# Patient Record
Sex: Male | Born: 2000 | Race: Black or African American | Hispanic: No | Marital: Single
Health system: Southern US, Community
[De-identification: ages and names within clinical notes are randomized; demographics above are authoritative.]

---
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
## 2022-07-30 ENCOUNTER — Ambulatory Visit (HOSPITAL_COMMUNITY)
Admission: EM | Admit: 2022-07-30 | Discharge: 2022-07-30 | Disposition: A | Discharge status: Home / Self Care | Payer: Medicaid Other | Attending: Psychiatry | Admitting: Psychiatry

## 2022-07-30 DIAGNOSIS — R454 Irritability and anger: Secondary | ICD-10-CM

## 2022-07-30 DIAGNOSIS — F1290 Cannabis use, unspecified, uncomplicated: Secondary | ICD-10-CM | POA: Insufficient documentation

## 2022-07-30 DIAGNOSIS — F909 Attention-deficit hyperactivity disorder, unspecified type: Secondary | ICD-10-CM | POA: Insufficient documentation

## 2022-07-30 DIAGNOSIS — F419 Anxiety disorder, unspecified: Secondary | ICD-10-CM | POA: Insufficient documentation

## 2022-07-30 DIAGNOSIS — Z9151 Personal history of suicidal behavior: Secondary | ICD-10-CM | POA: Insufficient documentation

## 2022-07-30 DIAGNOSIS — R45851 Suicidal ideations: Secondary | ICD-10-CM | POA: Insufficient documentation

## 2022-07-30 NOTE — Progress Notes (Signed)
   07/30/22 1411  [place] (Walk-ins at [facility] only)  How Did You Hear About [place]? Self  What Is the Reason for Your Visit/Call Today? Patient is a 22 year old male that presents voluntary to [facility] as a walk in. Patient denies any S/I, H/I or AVH although reports having passive S/I earlier this date but denies any plan or intent.  Patient reports ongoing depression and anxiety for the last several months with symptoms increasing over the last two weeks associated with anger management. Patient reports a prior treatment history "years ago" although is vague in reference to time line. Patent currently resides with his grandmother who brought him in today. Patient is trying to get shared custody of his one year old child who is currently residing with the mother. Patient reports ongoing Cannabis use although is vague in reference to amounts used and time frame. Patient is requesting assistance with counseling to address ongoing concerns.  How Long Has This Been Causing You Problems? 1 wk - 1 month  Have You Recently Had Any Thoughts About Hurting Yourself? No  Are You Planning to Commit Suicide/Harm Yourself At This time? No  Have you Recently Had Thoughts About [place]? No  Are You Planning To Harm Someone At This Time? No  Are you currently experiencing any auditory, visual or other hallucinations? No  Have You Used Any Alcohol or Drugs in the Past 24 Hours? No  Do you have any current medical co-morbidities that require immediate attention? No  Clinician description of patient physical appearance/behavior: Pt presents cooperative and is open to treatment interventions  What Do You Feel Would Help You the Most Today? Treatment for Depression or other mood problem  If access to [facility] Urgent Care was not available, would you have sought care in the Emergency Department? No  Determination of Need Routine (7 days)  Options For Referral Outpatient Therapy
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
## 2022-07-30 NOTE — ED Notes (Signed)
Discharge instructions provided and Pt stated understanding. Pt alert, orient and ambulatory prior to d/c from facility. Personal belongings returned. Safety maintained.

## 2022-07-30 NOTE — Discharge Instructions (Addendum)
Please come to [facility] (this facility, SECOND FLOOR) during walk in hours for appointment with psychiatrist/provider for further medication management and for therapists for therapy.   Walk in hours for therapy/counseling: Monday through Thursday 7:30AM until slots are full. Every Friday 12PM until slots are full.  Walk in hours for psychiatry/medication management: Monday through Friday 7:30AM until slots are full.   When you arrive please take the elevators to the SECOND floor. If you are unsure of where to go, inform the front desk that you are here for open access hours for the outpatient clinic and they will assist you with directions upstairs.   Walk ins are seen first come, first served. You may not be seen the same day you arrive. Please arrive early, such as by 7AM, to increase the likelihood of being seen the same day.  Address:  [address], in [place], [place] Ph: [phone]   [facility] Address: [place], [place], [place] [address] Phone: [phone]  Supported Employment The supported employment program is a person-centered, individualized, evidence-based support service that helps members choose, acquire, and maintain competitive employment in our community. This service supports the varying needs of individuals and promotes community inclusion and employment success. Members enrolled in the supported employment program can expect the following:  Development of an individual career plan Community based job placement Job shadowing Job development On-site job Teacher, early years/pre and support  Supported Education Supported education helps our members receive the education and training they need to achieve their learning and recovery goals. This will assist members with becoming gainfully employed in the job or career of their choice. The program includes assistance with: Registering for disability  accommodations Enrolling in school and registering for classes Learning communication skills Scheduling tutoring sessions within your school [facility] partners with [employer] to help increase the success of clients seeking employment and educational goals.  Want to learn more about our programs?   Please contact our intake department INTAKE: [phone] Ext [phone]  Mailing: [place]   [place], [place] [address]   [email or web address]

## 2022-08-11 ENCOUNTER — Ambulatory Visit (HOSPITAL_COMMUNITY): Payer: Medicaid Other | Admitting: Physician Assistant

## 2022-08-21 ENCOUNTER — Emergency Department (HOSPITAL_COMMUNITY): Payer: Medicaid Other

## 2022-08-21 ENCOUNTER — Inpatient Hospital Stay (HOSPITAL_COMMUNITY): Payer: Medicaid Other

## 2022-08-21 ENCOUNTER — Inpatient Hospital Stay (HOSPITAL_COMMUNITY)
Admission: EM | Admit: 2022-08-21 | Discharge: 2022-08-28 | DRG: 957 | Disposition: A | Discharge status: Home Health | Payer: Medicaid Other | Attending: General Surgery | Admitting: General Surgery

## 2022-08-21 ENCOUNTER — Encounter (HOSPITAL_COMMUNITY): Payer: Self-pay

## 2022-08-21 DIAGNOSIS — S27321A Contusion of lung, unilateral, initial encounter: Secondary | ICD-10-CM

## 2022-08-21 DIAGNOSIS — I7102 Dissection of abdominal aorta: Secondary | ICD-10-CM

## 2022-08-21 DIAGNOSIS — R451 Restlessness and agitation: Secondary | ICD-10-CM | POA: Diagnosis present

## 2022-08-21 DIAGNOSIS — S3600XA Unspecified injury of spleen, initial encounter: Secondary | ICD-10-CM

## 2022-08-21 DIAGNOSIS — F4325 Adjustment disorder with mixed disturbance of emotions and conduct: Secondary | ICD-10-CM | POA: Diagnosis not present

## 2022-08-21 DIAGNOSIS — S060XAA Concussion with loss of consciousness status unknown, initial encounter: Secondary | ICD-10-CM | POA: Diagnosis present

## 2022-08-21 DIAGNOSIS — R7401 Elevation of levels of liver transaminase levels: Secondary | ICD-10-CM | POA: Diagnosis present

## 2022-08-21 DIAGNOSIS — R402112 Coma scale, eyes open, never, at arrival to emergency department: Secondary | ICD-10-CM | POA: Diagnosis present

## 2022-08-21 DIAGNOSIS — I7103 Dissection of thoracoabdominal aorta: Secondary | ICD-10-CM | POA: Diagnosis present

## 2022-08-21 DIAGNOSIS — S36031A Moderate laceration of spleen, initial encounter: Secondary | ICD-10-CM | POA: Diagnosis present

## 2022-08-21 DIAGNOSIS — Y92410 Unspecified street and highway as the place of occurrence of the external cause: Secondary | ICD-10-CM | POA: Diagnosis not present

## 2022-08-21 DIAGNOSIS — N179 Acute kidney failure, unspecified: Secondary | ICD-10-CM | POA: Diagnosis present

## 2022-08-21 DIAGNOSIS — R402212 Coma scale, best verbal response, none, at arrival to emergency department: Secondary | ICD-10-CM | POA: Diagnosis present

## 2022-08-21 DIAGNOSIS — D62 Acute posthemorrhagic anemia: Secondary | ICD-10-CM | POA: Diagnosis not present

## 2022-08-21 DIAGNOSIS — R402342 Coma scale, best motor response, flexion withdrawal, at arrival to emergency department: Secondary | ICD-10-CM | POA: Diagnosis present

## 2022-08-21 DIAGNOSIS — S27322A Contusion of lung, bilateral, initial encounter: Secondary | ICD-10-CM | POA: Diagnosis present

## 2022-08-21 DIAGNOSIS — D649 Anemia, unspecified: Secondary | ICD-10-CM | POA: Diagnosis not present

## 2022-08-21 DIAGNOSIS — E876 Hypokalemia: Secondary | ICD-10-CM | POA: Diagnosis present

## 2022-08-21 DIAGNOSIS — R569 Unspecified convulsions: Secondary | ICD-10-CM | POA: Diagnosis present

## 2022-08-21 DIAGNOSIS — G4700 Insomnia, unspecified: Secondary | ICD-10-CM | POA: Diagnosis not present

## 2022-08-21 DIAGNOSIS — S52021A Displaced fracture of olecranon process without intraarticular extension of right ulna, initial encounter for closed fracture: Secondary | ICD-10-CM | POA: Diagnosis present

## 2022-08-21 DIAGNOSIS — S59901A Unspecified injury of right elbow, initial encounter: Secondary | ICD-10-CM | POA: Diagnosis present

## 2022-08-21 DIAGNOSIS — R739 Hyperglycemia, unspecified: Secondary | ICD-10-CM | POA: Diagnosis present

## 2022-08-21 DIAGNOSIS — I71019 Dissection of thoracic aorta, unspecified: Secondary | ICD-10-CM | POA: Diagnosis not present

## 2022-08-21 LAB — URINALYSIS, ROUTINE W REFLEX MICROSCOPIC
Bacteria, UA: NONE SEEN
Bilirubin Urine: NEGATIVE
Glucose, UA: 50 mg/dL — AB
Ketones, ur: NEGATIVE mg/dL
Leukocytes,Ua: NEGATIVE
Nitrite: NEGATIVE
Protein, ur: 100 mg/dL — AB
RBC / HPF: >50 RBC/hpf (ref 0–5)
Specific Gravity, Urine: 1.015 (ref 1.005–1.030)
pH: 5 (ref 5.0–8.0)

## 2022-08-21 LAB — CBC
HCT: 44 % (ref 39.0–52.0)
Hemoglobin: 15.1 g/dL (ref 13.0–17.0)
MCH: 29.9 pg (ref 26.0–34.0)
MCHC: 34.3 g/dL (ref 30.0–36.0)
MCV: 87.1 fL (ref 80.0–100.0)
Platelets: 210 10*3/uL (ref 150–400)
RBC: 5.05 MIL/uL (ref 4.22–5.81)
RDW: 12.1 % (ref 11.5–15.5)
WBC: 14.7 10*3/uL — ABNORMAL HIGH (ref 4.0–10.5)
nRBC: 0 % (ref 0.0–0.2)

## 2022-08-21 LAB — PROTIME-INR
INR: 1.1 (ref 0.8–1.2)
Prothrombin Time: 13.9 seconds (ref 11.4–15.2)

## 2022-08-21 LAB — SAMPLE TO BLOOD BANK

## 2022-08-21 LAB — COMPREHENSIVE METABOLIC PANEL
ALT: 182 U/L — ABNORMAL HIGH (ref 0–44)
AST: 258 U/L — ABNORMAL HIGH (ref 15–41)
Albumin: 4.2 g/dL (ref 3.5–5.0)
Alkaline Phosphatase: 50 U/L (ref 38–126)
Anion gap: 13 (ref 5–15)
BUN: 12 mg/dL (ref 6–20)
CO2: 22 mmol/L (ref 22–32)
Calcium: 9.1 mg/dL (ref 8.9–10.3)
Chloride: 102 mmol/L (ref 98–111)
Creatinine, Ser: 1.51 mg/dL — ABNORMAL HIGH (ref 0.61–1.24)
GFR, Estimated: >60 mL/min (ref >60)
Glucose, Bld: 165 mg/dL — ABNORMAL HIGH (ref 70–99)
Potassium: 3.2 mmol/L — ABNORMAL LOW (ref 3.5–5.1)
Sodium: 137 mmol/L (ref 135–145)
Total Bilirubin: 0.8 mg/dL (ref 0.3–1.2)
Total Protein: 6.8 g/dL (ref 6.5–8.1)

## 2022-08-21 LAB — I-STAT ARTERIAL BLOOD GAS, ED
Acid-base deficit: 6 mmol/L — ABNORMAL HIGH (ref 0.0–2.0)
Bicarbonate: 21.3 mmol/L (ref 20.0–28.0)
Calcium, Ion: 1.17 mmol/L (ref 1.15–1.40)
HCT: 35 % — ABNORMAL LOW (ref 39.0–52.0)
Hemoglobin: 11.9 g/dL — ABNORMAL LOW (ref 13.0–17.0)
O2 Saturation: 100 %
Patient temperature: 99.1
Potassium: 3.7 mmol/L (ref 3.5–5.1)
Sodium: 138 mmol/L (ref 135–145)
TCO2: 23 mmol/L (ref 22–32)
pCO2 arterial: 50.2 mmHg — ABNORMAL HIGH (ref 32–48)
pH, Arterial: 7.238 — ABNORMAL LOW (ref 7.35–7.45)
pO2, Arterial: 439 mmHg — ABNORMAL HIGH (ref 83–108)

## 2022-08-21 LAB — I-STAT CHEM 8, ED
BUN: 13 mg/dL (ref 6–20)
Calcium, Ion: 1.14 mmol/L — ABNORMAL LOW (ref 1.15–1.40)
Chloride: 101 mmol/L (ref 98–111)
Creatinine, Ser: 1.5 mg/dL — ABNORMAL HIGH (ref 0.61–1.24)
Glucose, Bld: 161 mg/dL — ABNORMAL HIGH (ref 70–99)
HCT: 45 % (ref 39.0–52.0)
Hemoglobin: 15.3 g/dL (ref 13.0–17.0)
Potassium: 3.3 mmol/L — ABNORMAL LOW (ref 3.5–5.1)
Sodium: 141 mmol/L (ref 135–145)
TCO2: 24 mmol/L (ref 22–32)

## 2022-08-21 LAB — LACTIC ACID, PLASMA: Lactic Acid, Venous: 4.1 mmol/L (ref 0.5–1.9)

## 2022-08-21 LAB — ETHANOL: Alcohol, Ethyl (B): <10 mg/dL (ref <10)

## 2022-08-21 MED ORDER — OXYCODONE HCL 5 MG PO TABS: 5.0000 mg | ORAL_TABLET | ORAL | Status: DC | PRN

## 2022-08-21 MED ORDER — CHLORHEXIDINE GLUCONATE CLOTH 2 % EX PADS
6.0000 | MEDICATED_PAD | Freq: Every day | CUTANEOUS | Status: DC
  Administered 2022-08-21 – 2022-08-28 (×9): 6 via TOPICAL

## 2022-08-21 MED ORDER — ETOMIDATE 2 MG/ML IV SOLN
INTRAVENOUS | Status: AC | PRN
  Administered 2022-08-21: 20 mg via INTRAVENOUS

## 2022-08-21 MED ORDER — ORAL CARE MOUTH RINSE
15.0000 mL | OROMUCOSAL | Status: DC
  Administered 2022-08-21 – 2022-08-25 (×44): 15 mL via OROMUCOSAL

## 2022-08-21 MED ORDER — FENTANYL 2500MCG IN NS 250ML (10MCG/ML) PREMIX INFUSION
50.0000 ug/h | INTRAVENOUS | Status: DC
  Administered 2022-08-21: 200 ug/h via INTRAVENOUS
  Filled 2022-08-21: qty 250

## 2022-08-21 MED ORDER — FENTANYL BOLUS VIA INFUSION: 50.0000 ug | INTRAVENOUS | Status: DC | PRN

## 2022-08-21 MED ORDER — PROPOFOL 1000 MG/100ML IV EMUL
0.0000 ug/kg/min | INTRAVENOUS | Status: AC
  Administered 2022-08-22: 30 ug/kg/min via INTRAVENOUS
  Administered 2022-08-22: 50 ug/kg/min via INTRAVENOUS
  Administered 2022-08-22: 35 ug/kg/min via INTRAVENOUS
  Administered 2022-08-23 (×2): 50 ug/kg/min via INTRAVENOUS
  Administered 2022-08-23: 40 ug/kg/min via INTRAVENOUS
  Administered 2022-08-23: 50 ug/kg/min via INTRAVENOUS
  Administered 2022-08-24: 45 ug/kg/min via INTRAVENOUS
  Filled 2022-08-21 (×9): qty 100

## 2022-08-21 MED ORDER — FENTANYL BOLUS VIA INFUSION
50.0000 ug | INTRAVENOUS | Status: DC | PRN
  Administered 2022-08-21 – 2022-08-23 (×13): 100 ug via INTRAVENOUS
  Administered 2022-08-24: 50 ug via INTRAVENOUS
  Administered 2022-08-24 (×2): 100 ug via INTRAVENOUS

## 2022-08-21 MED ORDER — FENTANYL CITRATE PF 50 MCG/ML IJ SOSY: 50.0000 ug | PREFILLED_SYRINGE | Freq: Once | INTRAMUSCULAR | Status: DC

## 2022-08-21 MED ORDER — SUCCINYLCHOLINE CHLORIDE 20 MG/ML IJ SOLN
INTRAMUSCULAR | Status: AC | PRN
  Administered 2022-08-21: 100 mg via INTRAVENOUS

## 2022-08-21 MED ORDER — IOHEXOL 350 MG/ML SOLN
75.0000 mL | Freq: Once | INTRAVENOUS | Status: AC | PRN
  Administered 2022-08-21: 75 mL via INTRAVENOUS

## 2022-08-21 MED ORDER — ORAL CARE MOUTH RINSE: 15.0000 mL | OROMUCOSAL | Status: DC | PRN

## 2022-08-21 MED ORDER — ONDANSETRON HCL 4 MG/2ML IJ SOLN
4.0000 mg | Freq: Four times a day (QID) | INTRAMUSCULAR | Status: DC | PRN
  Administered 2022-08-27 (×2): 4 mg via INTRAVENOUS
  Filled 2022-08-21 (×2): qty 2

## 2022-08-21 MED ORDER — DOCUSATE SODIUM 50 MG/5ML PO LIQD
100.0000 mg | Freq: Two times a day (BID) | ORAL | Status: DC
  Administered 2022-08-23 – 2022-08-24 (×3): 100 mg
  Filled 2022-08-21 (×5): qty 10

## 2022-08-21 MED ORDER — KCL IN DEXTROSE-NACL 20-5-0.9 MEQ/L-%-% IV SOLN
INTRAVENOUS | Status: DC
  Filled 2022-08-21 (×2): qty 1000

## 2022-08-21 MED ORDER — DOCUSATE SODIUM 50 MG/5ML PO LIQD: 100.0000 mg | Freq: Two times a day (BID) | ORAL | Status: DC

## 2022-08-21 MED ORDER — FENTANYL 2500MCG IN NS 250ML (10MCG/ML) PREMIX INFUSION
50.0000 ug/h | INTRAVENOUS | Status: DC
  Administered 2022-08-22: 175 ug/h via INTRAVENOUS
  Administered 2022-08-23 (×2): 150 ug/h via INTRAVENOUS
  Administered 2022-08-23: 200 ug/h via INTRAVENOUS
  Filled 2022-08-21 (×6): qty 250

## 2022-08-21 MED ORDER — ONDANSETRON 4 MG PO TBDP: 4.0000 mg | ORAL_TABLET | Freq: Four times a day (QID) | ORAL | Status: DC | PRN

## 2022-08-21 MED ORDER — POLYETHYLENE GLYCOL 3350 17 G PO PACK
17.0000 g | PACK | Freq: Every day | ORAL | Status: DC
  Administered 2022-08-23 – 2022-08-24 (×2): 17 g
  Filled 2022-08-21 (×3): qty 1

## 2022-08-21 MED ORDER — LEVETIRACETAM IN NACL 500 MG/100ML IV SOLN
500.0000 mg | Freq: Two times a day (BID) | INTRAVENOUS | Status: DC
  Administered 2022-08-21: 500 mg via INTRAVENOUS
  Filled 2022-08-21: qty 100

## 2022-08-21 MED ORDER — PROPOFOL 1000 MG/100ML IV EMUL
INTRAVENOUS | Status: AC | PRN
  Administered 2022-08-21: 50 mg via INTRAVENOUS

## 2022-08-21 MED ORDER — ACETAMINOPHEN 500 MG PO TABS: 1000.0000 mg | ORAL_TABLET | Freq: Four times a day (QID) | ORAL | Status: DC | PRN

## 2022-08-21 NOTE — ED Provider Notes (Signed)
  [place] Provider Note   CSN: [id number] Arrival date & time: 08/21/22  2049     History {Add pertinent medical, surgical, social history, OB history to HPI:1} No chief complaint on file.   Kenneth Hooper is a 22 y.o. male.  HPI     Home Medications Prior to Admission medications   Not on File      Allergies    Patient has no allergy information on record.    Review of Systems   Review of Systems  Physical Exam Updated Vital Signs There were no vitals taken for this visit. Physical Exam  ED Results / Procedures / Treatments   Labs (all labs ordered are listed, but only abnormal results are displayed) Labs Reviewed  I-STAT CHEM 8, ED - Abnormal; Notable for the following components:      Result Value   Potassium 3.3 (*)    Creatinine, Ser 1.50 (*)    Glucose, Bld 161 (*)    Calcium, Ion 1.14 (*)    All other components within normal limits  COMPREHENSIVE METABOLIC PANEL  CBC  ETHANOL  URINALYSIS, ROUTINE W REFLEX MICROSCOPIC  LACTIC ACID, PLASMA  PROTIME-INR  RAPID URINE DRUG SCREEN, HOSP PERFORMED  SAMPLE TO BLOOD BANK    EKG None  Radiology No results found.  Procedures Procedures  {Document cardiac monitor, telemetry assessment procedure when appropriate:1}  Medications Ordered in ED Medications  fentaNYL 2574mcg in NS 284mL (97mcg/ml) infusion-PREMIX (has no administration in time range)  fentaNYL (SUBLIMAZE) bolus via infusion 50-100 mcg (has no administration in time range)    ED Course/ Medical Decision Making/ A&P   {   Click here for ABCD2, HEART and other calculatorsREFRESH Note before signing :1}                          Medical Decision Making Amount and/or Complexity of Data Reviewed Labs: ordered. Radiology: ordered.   ***  {Document critical care time when appropriate:1} {Document review of labs and clinical decision tools ie heart score, Chads2Vasc2 etc:1}  {Document  your independent review of radiology images, and any outside records:1} {Document your discussion with family members, caretakers, and with consultants:1} {Document social determinants of health affecting pt's care:1} {Document your decision making why or why not admission, treatments were needed:1} Final Clinical Impression(s) / ED Diagnoses Final diagnoses:  None    Rx / DC Orders ED Discharge Orders     None
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
## 2022-08-21 NOTE — ED Notes (Signed)
[place] J collar placed on pt

## 2022-08-21 NOTE — ED Notes (Signed)
Pt belongings placed in to belongings bags at bedside

## 2022-08-21 NOTE — Progress Notes (Signed)
Orthopedic Tech Progress Note Patient Details:  Kenneth Hooper 2001-01-18 [id number]  Patient ID: Kenneth Hooper, male   DOB: [birth year], 22 y.o.   MRN: [id number] I attended trauma page. Karolee Stamps 08/21/2022, 9:25 PM
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
## 2022-08-22 ENCOUNTER — Inpatient Hospital Stay (HOSPITAL_COMMUNITY): Payer: Medicaid Other

## 2022-08-22 ENCOUNTER — Encounter (HOSPITAL_COMMUNITY): Payer: Self-pay

## 2022-08-22 ENCOUNTER — Other Ambulatory Visit: Payer: Self-pay

## 2022-08-22 DIAGNOSIS — I71019 Dissection of thoracic aorta, unspecified: Secondary | ICD-10-CM

## 2022-08-22 LAB — RAPID URINE DRUG SCREEN, HOSP PERFORMED
Amphetamines: NOT DETECTED
Barbiturates: NOT DETECTED
Benzodiazepines: POSITIVE — AB
Cocaine: NOT DETECTED
Opiates: NOT DETECTED
Tetrahydrocannabinol: POSITIVE — AB

## 2022-08-22 LAB — CBC
HCT: 39.6 % (ref 39.0–52.0)
HCT: 33.8 % — ABNORMAL LOW (ref 39.0–52.0)
Hemoglobin: 13.3 g/dL (ref 13.0–17.0)
Hemoglobin: 11.7 g/dL — ABNORMAL LOW (ref 13.0–17.0)
MCH: 29.8 pg (ref 26.0–34.0)
MCH: 30.3 pg (ref 26.0–34.0)
MCHC: 33.6 g/dL (ref 30.0–36.0)
MCHC: 34.6 g/dL (ref 30.0–36.0)
MCV: 88.6 fL (ref 80.0–100.0)
MCV: 87.6 fL (ref 80.0–100.0)
Platelets: 108 10*3/uL — ABNORMAL LOW (ref 150–400)
Platelets: 105 10*3/uL — ABNORMAL LOW (ref 150–400)
RBC: 4.47 MIL/uL (ref 4.22–5.81)
RBC: 3.86 MIL/uL — ABNORMAL LOW (ref 4.22–5.81)
RDW: 12.2 % (ref 11.5–15.5)
RDW: 12.3 % (ref 11.5–15.5)
WBC: 12.6 10*3/uL — ABNORMAL HIGH (ref 4.0–10.5)
WBC: 10.9 10*3/uL — ABNORMAL HIGH (ref 4.0–10.5)
nRBC: 0 % (ref 0.0–0.2)
nRBC: 0 % (ref 0.0–0.2)

## 2022-08-22 LAB — HEPATIC FUNCTION PANEL
ALT: 140 U/L — ABNORMAL HIGH (ref 0–44)
AST: 158 U/L — ABNORMAL HIGH (ref 15–41)
Albumin: 3.4 g/dL — ABNORMAL LOW (ref 3.5–5.0)
Alkaline Phosphatase: 43 U/L (ref 38–126)
Bilirubin, Direct: 0.1 mg/dL (ref 0.0–0.2)
Indirect Bilirubin: 0.7 mg/dL (ref 0.3–0.9)
Total Bilirubin: 0.8 mg/dL (ref 0.3–1.2)
Total Protein: 5.3 g/dL — ABNORMAL LOW (ref 6.5–8.1)

## 2022-08-22 LAB — BASIC METABOLIC PANEL
Anion gap: 11 (ref 5–15)
BUN: 15 mg/dL (ref 6–20)
CO2: 22 mmol/L (ref 22–32)
Calcium: 8.3 mg/dL — ABNORMAL LOW (ref 8.9–10.3)
Chloride: 108 mmol/L (ref 98–111)
Creatinine, Ser: 1.73 mg/dL — ABNORMAL HIGH (ref 0.61–1.24)
GFR, Estimated: 57 mL/min — ABNORMAL LOW (ref >60)
Glucose, Bld: 126 mg/dL — ABNORMAL HIGH (ref 70–99)
Potassium: 3.8 mmol/L (ref 3.5–5.1)
Sodium: 141 mmol/L (ref 135–145)

## 2022-08-22 LAB — HIV ANTIBODY (ROUTINE TESTING W REFLEX): HIV Screen 4th Generation wRfx: NONREACTIVE

## 2022-08-22 LAB — TRIGLYCERIDES: Triglycerides: 122 mg/dL (ref <150)

## 2022-08-22 LAB — GLUCOSE, CAPILLARY
Glucose-Capillary: 79 mg/dL (ref 70–99)
Glucose-Capillary: 85 mg/dL (ref 70–99)

## 2022-08-22 LAB — MRSA NEXT GEN BY PCR, NASAL: MRSA by PCR Next Gen: NOT DETECTED

## 2022-08-22 MED ORDER — LACTATED RINGERS IV SOLN: INTRAVENOUS | Status: DC

## 2022-08-22 MED ORDER — ACETAMINOPHEN 500 MG PO TABS
1000.0000 mg | ORAL_TABLET | Freq: Four times a day (QID) | ORAL | Status: DC
  Administered 2022-08-22 – 2022-08-26 (×16): 1000 mg
  Filled 2022-08-22 (×18): qty 2

## 2022-08-22 MED ORDER — ROCURONIUM BROMIDE 10 MG/ML (PF) SYRINGE
100.0000 mg | PREFILLED_SYRINGE | Freq: Once | INTRAVENOUS | Status: AC
  Filled 2022-08-22: qty 10

## 2022-08-22 MED ORDER — PROSOURCE TF20 ENFIT COMPATIBL EN LIQD
60.0000 mL | Freq: Every day | ENTERAL | Status: DC
  Administered 2022-08-22 – 2022-08-24 (×3): 60 mL
  Filled 2022-08-22 (×3): qty 60

## 2022-08-22 MED ORDER — IOHEXOL 350 MG/ML SOLN
100.0000 mL | Freq: Once | INTRAVENOUS | Status: AC | PRN
  Administered 2022-08-22: 100 mL via INTRAVENOUS

## 2022-08-22 MED ORDER — LACTATED RINGERS IV BOLUS
1000.0000 mL | Freq: Once | INTRAVENOUS | Status: AC
  Administered 2022-08-22: 1000 mL via INTRAVENOUS

## 2022-08-22 MED ORDER — METHOCARBAMOL 500 MG PO TABS
1000.0000 mg | ORAL_TABLET | Freq: Three times a day (TID) | ORAL | Status: DC
  Administered 2022-08-22 – 2022-08-26 (×13): 1000 mg
  Filled 2022-08-22 (×13): qty 2

## 2022-08-22 MED ORDER — SODIUM CHLORIDE 0.9 % IV SOLN: INTRAVENOUS | Status: DC | PRN

## 2022-08-22 MED ORDER — OXYCODONE HCL 5 MG PO TABS
5.0000 mg | ORAL_TABLET | ORAL | Status: DC | PRN
  Administered 2022-08-22 (×2): 5 mg
  Administered 2022-08-23 – 2022-08-25 (×9): 10 mg
  Administered 2022-08-25 – 2022-08-26 (×2): 5 mg
  Administered 2022-08-26 – 2022-08-27 (×2): 10 mg
  Administered 2022-08-27: 5 mg
  Administered 2022-08-27 – 2022-08-28 (×6): 10 mg
  Filled 2022-08-22 (×2): qty 2
  Filled 2022-08-22: qty 1
  Filled 2022-08-22 (×6): qty 2
  Filled 2022-08-22 (×2): qty 1
  Filled 2022-08-22 (×3): qty 2
  Filled 2022-08-22 (×2): qty 1
  Filled 2022-08-22 (×6): qty 2

## 2022-08-22 MED ORDER — VITAL HIGH PROTEIN PO LIQD
1000.0000 mL | ORAL | Status: DC
  Administered 2022-08-22: 1000 mL
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
## 2022-08-22 NOTE — Progress Notes (Signed)
I reviewed the CT scan.  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  This all originates below the area that would be typical for an aortic transection.  He has no signs of malperfusion.  No surgical intervention is recommended at this time.  He should continue with blood pressure control with target systolic pressure less than 130.  Annamarie Major
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
## 2022-08-22 NOTE — Progress Notes (Signed)
Orthopedic Tech Progress Note Patient Details:  Kenneth Hooper June 15, 2001 [id number]  Ortho Devices Type of Ortho Device: Long arm splint Ortho Device/Splint Location: RUE Ortho Device/Splint Interventions: Ordered, Application, Adjustment   Post Interventions Patient Tolerated: Fair Heavily padded long arm splint applied. Vernona Rieger 08/22/2022, 12:08 PM

## 2022-08-22 NOTE — Progress Notes (Signed)
Trauma Event Note  Discussed CTA results with Dr Bobbye Morton, no additional interventions at this time. Keep SBP <130. UOP has been >38m/hr, adequate 31.545m63mL based on recorded patient weight of 63 kg.   AM lab orders placed for BMP and CBC.  [facility]ler  Trauma Response RN  Please call TRN at [phone] further assistance.

## 2022-08-22 NOTE — Progress Notes (Signed)
Trauma Event Note  Rounded on patient this AM, verified with Dr Bobbye Morton verbal order to removed C-collar. Repeat CT Cspine negative for injury.  Assisted with transport to CT for repeat CTA. 20G IV placed in patients L medial AC. Patient stable throughout transport.  Also spoke with 4N regarding visitation for patient. Patients mom has requested patient be a confidential patient with restrictions to visitation. 4N CRN Elmyra Ricks explained to patients mom confidential patient restrictions including only 2 visitors allowed and patient being private in our system. Mom requested restrictions to dad's visitation rights and CRN communicated this was not something we could legally provide unless there was a concern for the patients safety. She agreed and understood. Patients password was changed to provide confidentiality for health information shared over the phone. Mom acknowledged that once patient is awake he will be able to make these decisions for himself.  Park Pope Maylen Waltermire  Trauma Response RN  Please call TRN at [phone] for further assistance.
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
## 2022-08-22 NOTE — Progress Notes (Signed)
2 RT's attempted x3 attempts to obtain Arterial line on LR. 3rd attempt clotted off after successful placement. MD made aware. Pressure held x10 minutes.

## 2022-08-22 NOTE — Progress Notes (Signed)
   08/22/22 0212  Spiritual Encounters  Type of Visit Follow up  Care provided to: Family  Referral source Nurse (RN/NT/LPN)  Reason for visit Urgent spiritual support  OnCall Visit Yes  Spiritual Framework  Presenting Themes Impactful experiences and emotions;Other (comment) (prayer for healing)  Values/beliefs God as healer  Patient Stress Factors Health changes;Major life changes  Family Stress Factors Major life changes  Interventions  Spiritual Care Interventions Made Established relationship of care and support;Compassionate presence;Reflective listening;Normalization of emotions;Explored values/beliefs/practices/strengths;Prayer  Intervention Outcomes  Outcomes Connection to spiritual care;Awareness around self/spiritual resourses;Reduced anxiety   Chaplain responded ot page from nurse to provide family support. Chaplain provided compassionate presence, reflective listening and prayer for the family of the patient. Family is aware of how to reach out for further support. No follow up needed at this time.   Note Prepared by Abbott Pao, Chaplain Resident [phone]

## 2022-08-22 NOTE — Progress Notes (Signed)
Attempted to call mom just now x2. Went directly to voicemail each time

## 2022-08-22 NOTE — Progress Notes (Signed)
Patient transported to CT and back without complications. RN at bedside.  

## 2022-08-23 ENCOUNTER — Inpatient Hospital Stay (HOSPITAL_COMMUNITY): Payer: Medicaid Other

## 2022-08-23 ENCOUNTER — Inpatient Hospital Stay (HOSPITAL_COMMUNITY): Payer: Medicaid Other | Admitting: Anesthesiology

## 2022-08-23 ENCOUNTER — Encounter (HOSPITAL_COMMUNITY): Admission: EM | Disposition: A | Discharge status: Home Health | Payer: Self-pay | Source: Home / Self Care

## 2022-08-23 ENCOUNTER — Other Ambulatory Visit: Payer: Self-pay

## 2022-08-23 DIAGNOSIS — S52021A Displaced fracture of olecranon process without intraarticular extension of right ulna, initial encounter for closed fracture: Secondary | ICD-10-CM

## 2022-08-23 DIAGNOSIS — D649 Anemia, unspecified: Secondary | ICD-10-CM

## 2022-08-23 HISTORY — PX: ORIF ELBOW FRACTURE: SHX5031

## 2022-08-23 LAB — CBC
HCT: 35.6 % — ABNORMAL LOW (ref 39.0–52.0)
Hemoglobin: 12.2 g/dL — ABNORMAL LOW (ref 13.0–17.0)
MCH: 29.5 pg (ref 26.0–34.0)
MCHC: 34.3 g/dL (ref 30.0–36.0)
MCV: 86.2 fL (ref 80.0–100.0)
Platelets: 109 10*3/uL — ABNORMAL LOW (ref 150–400)
RBC: 4.13 MIL/uL — ABNORMAL LOW (ref 4.22–5.81)
RDW: 12.2 % (ref 11.5–15.5)
WBC: 8.2 10*3/uL (ref 4.0–10.5)
nRBC: 0 % (ref 0.0–0.2)

## 2022-08-23 LAB — BASIC METABOLIC PANEL
Anion gap: 8 (ref 5–15)
BUN: 13 mg/dL (ref 6–20)
CO2: 22 mmol/L (ref 22–32)
Calcium: 8.2 mg/dL — ABNORMAL LOW (ref 8.9–10.3)
Chloride: 110 mmol/L (ref 98–111)
Creatinine, Ser: 1.47 mg/dL — ABNORMAL HIGH (ref 0.61–1.24)
GFR, Estimated: >60 mL/min (ref >60)
Glucose, Bld: 95 mg/dL (ref 70–99)
Potassium: 3.4 mmol/L — ABNORMAL LOW (ref 3.5–5.1)
Sodium: 140 mmol/L (ref 135–145)

## 2022-08-23 LAB — GLUCOSE, CAPILLARY
Glucose-Capillary: 103 mg/dL — ABNORMAL HIGH (ref 70–99)
Glucose-Capillary: 72 mg/dL (ref 70–99)
Glucose-Capillary: 88 mg/dL (ref 70–99)
Glucose-Capillary: 97 mg/dL (ref 70–99)
Glucose-Capillary: 118 mg/dL — ABNORMAL HIGH (ref 70–99)
Glucose-Capillary: 148 mg/dL — ABNORMAL HIGH (ref 70–99)

## 2022-08-23 SURGERY — OPEN REDUCTION INTERNAL FIXATION (ORIF) ELBOW/OLECRANON FRACTURE
Anesthesia: General | Site: Elbow | Laterality: Right

## 2022-08-23 MED ORDER — NICARDIPINE HCL IN NACL 20-0.86 MG/200ML-% IV SOLN
3.0000 mg/h | INTRAVENOUS | Status: DC
  Administered 2022-08-24 (×3): 5 mg/h via INTRAVENOUS
  Filled 2022-08-23 (×3): qty 200

## 2022-08-23 MED ORDER — FENTANYL CITRATE (PF) 250 MCG/5ML IJ SOLN
INTRAMUSCULAR | Status: DC | PRN
  Administered 2022-08-23 (×2): 50 ug via INTRAVENOUS

## 2022-08-23 MED ORDER — VANCOMYCIN HCL 1000 MG IV SOLR
INTRAVENOUS | Status: DC | PRN
  Administered 2022-08-23: 1000 mg

## 2022-08-23 MED ORDER — 0.9 % SODIUM CHLORIDE (POUR BTL) OPTIME
TOPICAL | Status: DC | PRN
  Administered 2022-08-23: 1000 mL

## 2022-08-23 MED ORDER — CEFAZOLIN SODIUM-DEXTROSE 2-3 GM-%(50ML) IV SOLR
INTRAVENOUS | Status: DC | PRN
  Administered 2022-08-23: 2 g via INTRAVENOUS

## 2022-08-23 MED ORDER — ROCURONIUM BROMIDE 10 MG/ML (PF) SYRINGE
PREFILLED_SYRINGE | INTRAVENOUS | Status: AC
  Filled 2022-08-23: qty 30

## 2022-08-23 MED ORDER — CEFAZOLIN SODIUM-DEXTROSE 2-4 GM/100ML-% IV SOLN
INTRAVENOUS | Status: AC
  Filled 2022-08-23: qty 100

## 2022-08-23 MED ORDER — FENTANYL CITRATE (PF) 250 MCG/5ML IJ SOLN
INTRAMUSCULAR | Status: AC
  Filled 2022-08-23: qty 5

## 2022-08-23 MED ORDER — LIDOCAINE 2% (20 MG/ML) 5 ML SYRINGE
INTRAMUSCULAR | Status: AC
  Filled 2022-08-23: qty 5

## 2022-08-23 MED ORDER — MIDAZOLAM HCL 2 MG/2ML IJ SOLN
INTRAMUSCULAR | Status: DC | PRN
  Administered 2022-08-23: 2 mg via INTRAVENOUS

## 2022-08-23 MED ORDER — LACTATED RINGERS IV SOLN: INTRAVENOUS | Status: DC | PRN

## 2022-08-23 MED ORDER — MIDAZOLAM HCL 2 MG/2ML IJ SOLN
INTRAMUSCULAR | Status: AC
  Filled 2022-08-23: qty 2

## 2022-08-23 MED ORDER — PHENYLEPHRINE 80 MCG/ML (10ML) SYRINGE FOR IV PUSH (FOR BLOOD PRESSURE SUPPORT)
PREFILLED_SYRINGE | INTRAVENOUS | Status: DC | PRN
  Administered 2022-08-23: 40 ug via INTRAVENOUS

## 2022-08-23 MED ORDER — ROCURONIUM BROMIDE 10 MG/ML (PF) SYRINGE
PREFILLED_SYRINGE | INTRAVENOUS | Status: DC | PRN
  Administered 2022-08-23 (×2): 50 mg via INTRAVENOUS

## 2022-08-23 MED ORDER — CEFAZOLIN SODIUM-DEXTROSE 2-4 GM/100ML-% IV SOLN
2.0000 g | Freq: Three times a day (TID) | INTRAVENOUS | Status: AC
  Administered 2022-08-23 – 2022-08-24 (×3): 2 g via INTRAVENOUS
  Filled 2022-08-23 (×3): qty 100

## 2022-08-23 MED ORDER — VANCOMYCIN HCL 1000 MG IV SOLR
INTRAVENOUS | Status: AC
  Filled 2022-08-23: qty 20

## 2022-08-23 MED ORDER — PIVOT 1.5 CAL PO LIQD
1000.0000 mL | ORAL | Status: DC
  Administered 2022-08-23 – 2022-08-25 (×4): 1000 mL
  Filled 2022-08-23 (×3): qty 1000

## 2022-08-23 MED ORDER — DEXMEDETOMIDINE HCL IN NACL 400 MCG/100ML IV SOLN
0.0000 ug/kg/h | INTRAVENOUS | Status: DC
  Administered 2022-08-23: 0.7 ug/kg/h via INTRAVENOUS
  Administered 2022-08-23: 0.4 ug/kg/h via INTRAVENOUS
  Administered 2022-08-24 (×2): 2 ug/kg/h via INTRAVENOUS
  Administered 2022-08-24: 0.7 ug/kg/h via INTRAVENOUS
  Administered 2022-08-24 – 2022-08-25 (×7): 2 ug/kg/h via INTRAVENOUS
  Administered 2022-08-26: 1.4 ug/kg/h via INTRAVENOUS
  Administered 2022-08-26 (×2): 1 ug/kg/h via INTRAVENOUS
  Administered 2022-08-26: 2 ug/kg/h via INTRAVENOUS
  Administered 2022-08-27: 0.5 ug/kg/h via INTRAVENOUS
  Filled 2022-08-23 (×12): qty 100
  Filled 2022-08-23: qty 200
  Filled 2022-08-23 (×2): qty 100
  Filled 2022-08-23: qty 200
  Filled 2022-08-23 (×2): qty 100

## 2022-08-23 MED ORDER — ENOXAPARIN SODIUM 30 MG/0.3ML IJ SOSY
30.0000 mg | PREFILLED_SYRINGE | Freq: Two times a day (BID) | INTRAMUSCULAR | Status: DC
  Administered 2022-08-23 – 2022-08-28 (×9): 30 mg via SUBCUTANEOUS
  Filled 2022-08-23 (×9): qty 0.3

## 2022-08-23 MED ORDER — PHENYLEPHRINE 80 MCG/ML (10ML) SYRINGE FOR IV PUSH (FOR BLOOD PRESSURE SUPPORT)
PREFILLED_SYRINGE | INTRAVENOUS | Status: AC
  Filled 2022-08-23: qty 20

## 2022-08-23 SURGICAL SUPPLY — 76 items
BAG COUNTER SPONGE SURGICOUNT (BAG) ×1 IMPLANT
BENZOIN TINCTURE PRP APPL 2/3 (GAUZE/BANDAGES/DRESSINGS) IMPLANT
BIT DRILL CALIBRATED 2.7 (BIT) IMPLANT
BLADE AVERAGE 25X9 (BLADE) IMPLANT
BLADE CLIPPER SURG (BLADE) ×1 IMPLANT
BLADE SURG 10 STRL SS (BLADE) IMPLANT
BNDG ELASTIC 4X5.8 VLCR STR LF (GAUZE/BANDAGES/DRESSINGS) IMPLANT
BNDG ELASTIC 6X5.8 VLCR STR LF (GAUZE/BANDAGES/DRESSINGS) IMPLANT
BNDG ESMARK 4X9 LF (GAUZE/BANDAGES/DRESSINGS) IMPLANT
BNDG GAUZE DERMACEA FLUFF 4 (GAUZE/BANDAGES/DRESSINGS) ×1 IMPLANT
BRUSH SCRUB EZ PLAIN DRY (MISCELLANEOUS) ×2 IMPLANT
CHLORAPREP W/TINT 26 (MISCELLANEOUS) ×1 IMPLANT
CLEANER TIP ELECTROSURG 2X2 (MISCELLANEOUS) ×1 IMPLANT
COVER SURGICAL LIGHT HANDLE (MISCELLANEOUS) ×2 IMPLANT
DRAPE C-ARM 42X72 X-RAY (DRAPES) ×1 IMPLANT
DRAPE C-ARMOR (DRAPES) ×1 IMPLANT
DRAPE INCISE IOBAN 66X45 STRL (DRAPES) IMPLANT
DRAPE ORTHO SPLIT 77X108 STRL (DRAPES) ×2
DRAPE SURG ORHT 6 SPLT 77X108 (DRAPES) ×2 IMPLANT
DRAPE U-SHAPE 47X51 STRL (DRAPES) ×1 IMPLANT
ELECT REM PT RETURN 9FT ADLT (ELECTROSURGICAL) ×1
ELECTRODE REM PT RTRN 9FT ADLT (ELECTROSURGICAL) ×1 IMPLANT
GAUZE SPONGE 4X4 12PLY STRL (GAUZE/BANDAGES/DRESSINGS) IMPLANT
GLOVE BIO SURGEON STRL SZ 6.5 (GLOVE) ×3 IMPLANT
GLOVE BIO SURGEON STRL SZ7.5 (GLOVE) ×4 IMPLANT
GLOVE BIOGEL PI IND STRL 6.5 (GLOVE) ×1 IMPLANT
GLOVE BIOGEL PI IND STRL 7.5 (GLOVE) ×1 IMPLANT
GOWN STRL REUS W/ TWL LRG LVL3 (GOWN DISPOSABLE) ×2 IMPLANT
GOWN STRL REUS W/TWL LRG LVL3 (GOWN DISPOSABLE) ×2
K-WIRE FIXATION 2.0X6 (WIRE) ×1
KIT BASIN OR (CUSTOM PROCEDURE TRAY) ×1 IMPLANT
KIT TURNOVER KIT B (KITS) ×1 IMPLANT
KWIRE FIXATION 2.0X6 (WIRE) IMPLANT
MANIFOLD NEPTUNE II (INSTRUMENTS) ×1 IMPLANT
NS IRRIG 1000ML POUR BTL (IV SOLUTION) ×1 IMPLANT
PACK ORTHO EXTREMITY (CUSTOM PROCEDURE TRAY) ×1 IMPLANT
PAD ABD 8X10 STRL (GAUZE/BANDAGES/DRESSINGS) IMPLANT
PAD ARMBOARD 7.5X6 YLW CONV (MISCELLANEOUS) ×2 IMPLANT
PAD CAST 3X4 CTTN HI CHSV (CAST SUPPLIES) IMPLANT
PAD CAST 4YDX4 CTTN HI CHSV (CAST SUPPLIES) IMPLANT
PADDING CAST COTTON 3X4 STRL (CAST SUPPLIES) ×1
PADDING CAST COTTON 4X4 STRL (CAST SUPPLIES) ×1
PADDING CAST COTTON 6X4 STRL (CAST SUPPLIES) IMPLANT
PLATE OLECRANON SM (Plate) IMPLANT
SCREW CORT T15 TPR 55X3.5XST (Screw) IMPLANT
SCREW CORTICAL 3.5X55MM (Screw) ×1 IMPLANT
SCREW CORTICAL LOW PROF 3.5X32 (Screw) IMPLANT
SCREW LOCK CORT STAR 3.5X16 (Screw) IMPLANT
SCREW LOW PROFILE 3.5X30MM TIS (Screw) IMPLANT
SCREW LP NL T15 3.5X20 (Screw) IMPLANT
SCREW LP NL T15 3.5X22 (Screw) IMPLANT
SCREW TIS LP 3.5X18 NS (Screw) IMPLANT
SLING ARM FOAM STRAP LRG (SOFTGOODS) IMPLANT
SPONGE T-LAP 18X18 ~~LOC~~+RFID (SPONGE) ×2 IMPLANT
STAPLER VISISTAT 35W (STAPLE) ×1 IMPLANT
STRIP CLOSURE SKIN 1/2X4 (GAUZE/BANDAGES/DRESSINGS) IMPLANT
SUCTION FRAZIER HANDLE 10FR (MISCELLANEOUS) ×1
SUCTION TUBE FRAZIER 10FR DISP (MISCELLANEOUS) ×1 IMPLANT
SUT MNCRL AB 3-0 PS2 18 (SUTURE) ×1 IMPLANT
SUT MON AB 2-0 CT1 36 (SUTURE) ×1 IMPLANT
SUT PDS AB 2-0 CT1 27 (SUTURE) IMPLANT
SUT PROLENE 0 CT (SUTURE) IMPLANT
SUT PROLENE 3 0 PS 2 (SUTURE) ×2 IMPLANT
SUT VIC AB 0 CT1 27 (SUTURE) ×2
SUT VIC AB 0 CT1 27XBRD ANBCTR (SUTURE) ×2 IMPLANT
SUT VIC AB 2-0 CT1 27 (SUTURE) ×2
SUT VIC AB 2-0 CT1 TAPERPNT 27 (SUTURE) ×2 IMPLANT
SUT VIC AB 2-0 CT3 27 (SUTURE) IMPLANT
SYR CONTROL 10ML LL (SYRINGE) ×1 IMPLANT
TOWEL GREEN STERILE (TOWEL DISPOSABLE) ×2 IMPLANT
TOWEL GREEN STERILE FF (TOWEL DISPOSABLE) IMPLANT
TUBE CONNECTING 12X1/4 (SUCTIONS) ×1 IMPLANT
UNDERPAD 30X36 HEAVY ABSORB (UNDERPADS AND DIAPERS) ×1 IMPLANT
WASHER 3.5MM (Orthopedic Implant) IMPLANT
WATER STERILE IRR 1000ML POUR (IV SOLUTION) ×1 IMPLANT
YANKAUER SUCT BULB TIP NO VENT (SUCTIONS) ×1 IMPLANT

## 2022-08-23 NOTE — Anesthesia Procedure Notes (Signed)
Date/Time: 08/23/2022 12:46 PM  Performed by: Mariea Clonts, CRNAPre-anesthesia Checklist: Patient identified, Emergency Drugs available, Suction available, Patient being monitored and Timeout performed Patient Re-evaluated:Patient Re-evaluated prior to induction Oxygen Delivery Method: Circle system utilized Preoxygenation: Pre-oxygenation with 100% oxygen Induction Type: Inhalational induction with existing ETT
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
## 2022-08-23 NOTE — TOC CAGE-AID Note (Signed)
Transition of Care [facility]) - CAGE-AID Screening   Patient Details  Name: Kenneth Hooper MRN: [id number] Date of Birth: May 14, 2001  [facility]) CM/SW Contact:    Jinger Neighbors, LCSW Phone Number: 08/23/2022, 10:26 AM   Clinical Narrative:  Pt intubated and unable to complete.    CAGE-AID Screening: Substance Abuse Screening unable to be completed due to: : Patient unable to participate

## 2022-08-23 NOTE — Transfer of Care (Signed)
Immediate Anesthesia Transfer of Care Note  Patient: Kenneth Hooper  Procedure(s) Performed: OPEN REDUCTION INTERNAL FIXATION (ORIF) RIGHT ELBOW/OLECRANON FRACTURE (Right: Elbow)  Patient Location: ICU  Anesthesia Type:General  Level of Consciousness: sedated and unresponsive  Airway & Oxygen Therapy: Patient remains intubated per anesthesia plan and Patient placed on Ventilator (see vital sign flow sheet for setting)  Post-op Assessment: Report given to RN and Post -op Vital signs reviewed and stable  Post vital signs: Reviewed and stable  Last Vitals:  Vitals Value Taken Time  BP 107/70 08/23/22 1420  Temp 35.5 C 08/23/22 1425  Pulse 64 08/23/22 1425  Resp 18 08/23/22 1425  SpO2 99 % 08/23/22 1425  Vitals shown include unvalidated device data.  Last Pain:  Vitals:   08/23/22 1422  TempSrc: Oral  PainSc:          Complications: No notable events documented.
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
## 2022-08-23 NOTE — Progress Notes (Cosign Needed)
Received patient back from OR placed on Ventilator, no events to report.
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
## 2022-08-23 NOTE — Consult Note (Signed)
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 retracting by 3 cm. These results were called by telephone at the time of interpretation on 08/21/2022 at 9:45 pm to provider Sherwood Gambler , who verbally acknowledged these results. Electronically Signed   By: Iven Finn M.D.   On: 08/21/2022 22:13   CT HEAD WO CONTRAST  Result Date: 08/21/2022 CLINICAL DATA:  Polytrauma, blunt; Head trauma, moderate-severe EXAM: CT HEAD WITHOUT CONTRAST CT CERVICAL SPINE WITHOUT CONTRAST TECHNIQUE: Multidetector CT imaging of the head and cervical spine was performed following the standard protocol without intravenous contrast. Multiplanar CT image reconstructions of the cervical spine were also generated. RADIATION DOSE REDUCTION: This exam was performed according to the departmental dose-optimization program which includes automated exposure control, adjustment of the mA and/or kV according to patient size  and/or use of iterative reconstruction technique. COMPARISON:  None Available. FINDINGS: CT HEAD FINDINGS Brain: No evidence of large-territorial acute infarction. No parenchymal hemorrhage. No mass lesion. No extra-axial collection. No mass effect or midline shift. No hydrocephalus. Basilar cisterns are patent. Vascular: No hyperdense vessel. Skull: No acute fracture or focal lesion. Sinuses/Orbits: Paranasal sinuses and mastoid air cells are clear. The orbits are unremarkable. Other: None. CT CERVICAL SPINE FINDINGS Alignment: Limited evaluation due to motion artifact. Skull base and vertebrae: Limited evaluation due to motion artifact. Soft tissues and spinal canal: Limited evaluation due to motion artifact. Upper chest: Please see separately dictated CT chest 08/21/2022. Other: None. IMPRESSION: 1. No acute intracranial abnormality. 2. Limited evaluation of the cervical spine due to motion artifact. Recommend repeat C-spine. These results were called by telephone at the time of interpretation on 08/21/2022 at 9:45 pm to provider Sherwood Gambler , who verbally acknowledged these results. Electronically Signed   By: Iven Finn M.D.   On: 08/21/2022 21:48   CT CERVICAL SPINE WO CONTRAST  Result Date: 08/21/2022 CLINICAL DATA:  Polytrauma, blunt; Head trauma, moderate-severe EXAM: CT HEAD WITHOUT CONTRAST CT CERVICAL SPINE WITHOUT CONTRAST TECHNIQUE: Multidetector CT imaging of the head and cervical spine was performed following the standard protocol without intravenous contrast. Multiplanar CT image reconstructions of the cervical spine were also generated. RADIATION DOSE REDUCTION: This exam was performed according to the departmental dose-optimization  program which includes automated exposure control, adjustment of the mA and/or kV according to patient size and/or use of iterative reconstruction technique. COMPARISON:  None Available. FINDINGS: CT HEAD FINDINGS Brain: No evidence of large-territorial acute  infarction. No parenchymal hemorrhage. No mass lesion. No extra-axial collection. No mass effect or midline shift. No hydrocephalus. Basilar cisterns are patent. Vascular: No hyperdense vessel. Skull: No acute fracture or focal lesion. Sinuses/Orbits: Paranasal sinuses and mastoid air cells are clear. The orbits are unremarkable. Other: None. CT CERVICAL SPINE FINDINGS Alignment: Limited evaluation due to motion artifact. Skull base and vertebrae: Limited evaluation due to motion artifact. Soft tissues and spinal canal: Limited evaluation due to motion artifact. Upper chest: Please see separately dictated CT chest 08/21/2022. Other: None. IMPRESSION: 1. No acute intracranial abnormality. 2. Limited evaluation of the cervical spine due to motion artifact. Recommend repeat C-spine. These results were called by telephone at the time of interpretation on 08/21/2022 at 9:45 pm to provider Sherwood Gambler , who verbally acknowledged these results. Electronically Signed   By: Iven Finn M.D.   On: 08/21/2022 21:48   DG Pelvis Portable  Result Date: 08/21/2022 CLINICAL DATA:  Trauma EXAM: PORTABLE PELVIS 1-2 VIEWS COMPARISON:  None Available. FINDINGS: There is no evidence of pelvic fracture or diastasis. No pelvic bone lesions are seen. IMPRESSION: Negative. Electronically Signed   By: Iven Finn M.D.   On: 08/21/2022 21:40   DG Chest Port 1 View  Result Date: 08/21/2022 CLINICAL DATA:  Trauma EXAM: PORTABLE CHEST 1 VIEW COMPARISON:  CT chest 08/21/2022 FINDINGS: Endotracheal tube with tip terminating 4 cm above the carina. Enteric tube coursing below the hemidiaphragm with tip collimated off view. The heart and mediastinal contours are within normal limits. Left peripheral upper lobe airspace opacity. No pulmonary edema. No pleural effusion. No pneumothorax. No acute osseous abnormality. IMPRESSION: 1. Left peripheral upper lobe airspace opacity. 2. Lines and tubes in appropriate position. Electronically  Signed   By: Iven Finn M.D.   On: 08/21/2022 21:39    Review of Systems  Unable to perform ROS: Intubated   Blood pressure 126/63, pulse 72, temperature 99.7 F (37.6 C), resp. rate 18, height 5' 10"$  (1.778 m), weight 63.8 kg, SpO2 100 %. Physical Exam Constitutional:      General: He is not in acute distress.    Appearance: He is well-developed. He is not diaphoretic.     Comments: Intubated  HENT:     Head: Normocephalic and atraumatic.  Eyes:     General: No scleral icterus.       Right eye: No discharge.        Left eye: No discharge.     Conjunctiva/sclera: Conjunctivae normal.  Cardiovascular:     Rate and Rhythm: Normal rate and regular rhythm.  Pulmonary:     Effort: Pulmonary effort is normal. No respiratory distress.  Musculoskeletal:     Comments: Right shoulder, elbow, wrist, digits- no skin wounds, posterior elbow splint in place, no instability, no blocks to motion  Sens  Ax/R/M/U could not assess  Mot   Ax/ R/ PIN/ M/ AIN/ U could not assess    Skin:    General: Skin is warm and dry.  Psychiatric:     Comments: Intubated     Assessment/Plan: Right olecranon fx -- Plan ORIF today with Dr. Doreatha Martin.    Lisette Abu, PA-C Orthopedic Surgery [phone] 08/23/2022, 9:04 AM

## 2022-08-23 NOTE — Procedures (Signed)
Cortrak  Person Inserting Tube:  Ranell Patrick D, RD Tube Type:  Cortrak - 43 inches Tube Size:  10 Tube Location:  Right nare Secured by: Bridle Technique Used to Measure Tube Placement:  Marking at nare/corner of mouth Cortrak Secured At:  69 cm Procedure Comments:  Cortrak Tube Team Note:  Consult received to place a Cortrak feeding tube.   X-ray is required, abdominal x-ray has been ordered by the Cortrak team. Please confirm tube placement before using the Cortrak tube.   If the tube becomes dislodged please keep the tube and contact the Cortrak team at [email or web address] for replacement.  If after hours and replacement cannot be delayed, place a NG tube and confirm placement with an abdominal x-ray.    Ranell Patrick, RD, LDN Clinical Dietitian RD pager # available in [place]  After hours/weekend pager # available in [facility]

## 2022-08-23 NOTE — Anesthesia Postprocedure Evaluation (Signed)
Anesthesia Post Note  Patient: Kenneth Hooper  Procedure(s) Performed: OPEN REDUCTION INTERNAL FIXATION (ORIF) RIGHT ELBOW/OLECRANON FRACTURE (Right: Elbow)     Patient location during evaluation: SICU Anesthesia Type: General Level of consciousness: sedated Pain management: pain level controlled Vital Signs Assessment: post-procedure vital signs reviewed and stable Respiratory status: patient remains intubated per anesthesia plan Cardiovascular status: stable Postop Assessment: no apparent nausea or vomiting Anesthetic complications: no  No notable events documented.  Last Vitals:  Vitals:   08/23/22 1600 08/23/22 1630  BP: (!) 108/59 109/64  Pulse: (!) 57 (!) 57  Resp: 18 18  Temp: (!) 35.6 C (!) 35.7 C  SpO2: 99% 99%    Last Pain:  Vitals:   08/23/22 1422  TempSrc: Oral  PainSc:                  Levora Werden S
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
## 2022-08-24 DIAGNOSIS — I71019 Dissection of thoracic aorta, unspecified: Secondary | ICD-10-CM | POA: Diagnosis not present

## 2022-08-24 LAB — BASIC METABOLIC PANEL
Anion gap: 6 (ref 5–15)
BUN: 14 mg/dL (ref 6–20)
CO2: 22 mmol/L (ref 22–32)
Calcium: 8 mg/dL — ABNORMAL LOW (ref 8.9–10.3)
Chloride: 113 mmol/L — ABNORMAL HIGH (ref 98–111)
Creatinine, Ser: 1.32 mg/dL — ABNORMAL HIGH (ref 0.61–1.24)
GFR, Estimated: >60 mL/min (ref >60)
Glucose, Bld: 122 mg/dL — ABNORMAL HIGH (ref 70–99)
Potassium: 3.9 mmol/L (ref 3.5–5.1)
Sodium: 141 mmol/L (ref 135–145)

## 2022-08-24 LAB — GLUCOSE, CAPILLARY
Glucose-Capillary: 134 mg/dL — ABNORMAL HIGH (ref 70–99)
Glucose-Capillary: 136 mg/dL — ABNORMAL HIGH (ref 70–99)
Glucose-Capillary: 183 mg/dL — ABNORMAL HIGH (ref 70–99)
Glucose-Capillary: 123 mg/dL — ABNORMAL HIGH (ref 70–99)
Glucose-Capillary: 130 mg/dL — ABNORMAL HIGH (ref 70–99)
Glucose-Capillary: 109 mg/dL — ABNORMAL HIGH (ref 70–99)

## 2022-08-24 MED ORDER — ZIPRASIDONE MESYLATE 20 MG IM SOLR
20.0000 mg | Freq: Once | INTRAMUSCULAR | Status: AC | PRN
  Administered 2022-08-26: 20 mg via INTRAMUSCULAR
  Filled 2022-08-24: qty 20

## 2022-08-24 MED ORDER — QUETIAPINE FUMARATE 100 MG PO TABS
100.0000 mg | ORAL_TABLET | Freq: Two times a day (BID) | ORAL | Status: DC
  Administered 2022-08-24: 100 mg

## 2022-08-24 MED ORDER — HALOPERIDOL LACTATE 5 MG/ML IJ SOLN: 10.0000 mg | Freq: Four times a day (QID) | INTRAMUSCULAR | Status: DC | PRN

## 2022-08-24 MED ORDER — MIDAZOLAM HCL 2 MG/2ML IJ SOLN
1.0000 mg | INTRAMUSCULAR | Status: AC | PRN
  Administered 2022-08-24 (×2): 1 mg via INTRAVENOUS
  Filled 2022-08-24 (×2): qty 2

## 2022-08-24 MED ORDER — QUETIAPINE FUMARATE 100 MG PO TABS
100.0000 mg | ORAL_TABLET | Freq: Two times a day (BID) | ORAL | Status: DC
  Administered 2022-08-24: 100 mg via ORAL
  Filled 2022-08-24 (×2): qty 1

## 2022-08-24 MED ORDER — HALOPERIDOL LACTATE 5 MG/ML IJ SOLN
10.0000 mg | Freq: Four times a day (QID) | INTRAMUSCULAR | Status: DC | PRN
  Administered 2022-08-24 – 2022-08-26 (×6): 10 mg via INTRAVENOUS
  Filled 2022-08-24 (×6): qty 2

## 2022-08-24 MED ORDER — ORAL CARE MOUTH RINSE
15.0000 mL | OROMUCOSAL | Status: DC
  Administered 2022-08-24 – 2022-08-28 (×14): 15 mL via OROMUCOSAL

## 2022-08-24 MED ORDER — ORAL CARE MOUTH RINSE: 15.0000 mL | OROMUCOSAL | Status: DC | PRN
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
## 2022-08-24 NOTE — Procedures (Signed)
Extubation Procedure Note  Patient Details:   Name: Kenneth Hooper DOB: September 06, 2000 MRN: [id number]   Airway Documentation:    Vent end date: 08/24/22 Vent end time: 0919   Evaluation  O2 sats: stable throughout and currently acceptable Complications: No apparent complications Patient did tolerate procedure well. Bilateral Breath Sounds: Clear, Diminished   Yes  Pt extubated to 3L [place] per MD order. Positive cuff leak noted prior to extubation. Pt able to speak and is spitting post extubation. Pt encouraged to use [place] to clear secretions.   Jesse Sans 08/24/2022, 9:19 AM

## 2022-08-24 NOTE — Progress Notes (Signed)
Orthopaedic Trauma Progress Note  SUBJECTIVE: Intubated, trying to pull at his tube. Follows some commands  OBJECTIVE:  Vitals:   08/24/22 0700 08/24/22 0734  BP: (!) 113/51 (!) 117/56  Pulse: 69 76  Resp: 17 11  Temp: 98.6 F (37 C)   SpO2: 97% 97%    General: Intubated Respiratory: No increased work of breathing.  RUE: Dressing clean, dry, intact.  Sling removed to help prevent patient from pulling at tube.  Wiggling fingers freely.  Moving elbow freely.+ Radial pulse.  Compartments soft and compressible  IMAGING: Stable post op imaging.   LABS:  Results for orders placed or performed during the hospital encounter of 08/21/22 (from the past 24 hour(s))  Glucose, capillary     Status: None   Collection Time: 08/23/22 11:51 AM  Result Value Ref Range   Glucose-Capillary 88 70 - 99 mg/dL  Glucose, capillary     Status: None   Collection Time: 08/23/22  4:01 PM  Result Value Ref Range   Glucose-Capillary 97 70 - 99 mg/dL  Glucose, capillary     Status: Abnormal   Collection Time: 08/23/22  7:37 PM  Result Value Ref Range   Glucose-Capillary 118 (H) 70 - 99 mg/dL  Glucose, capillary     Status: Abnormal   Collection Time: 08/23/22 11:13 PM  Result Value Ref Range   Glucose-Capillary 148 (H) 70 - 99 mg/dL  Glucose, capillary     Status: Abnormal   Collection Time: 08/24/22  3:37 AM  Result Value Ref Range   Glucose-Capillary 134 (H) 70 - 99 mg/dL  Glucose, capillary     Status: Abnormal   Collection Time: 08/24/22  7:54 AM  Result Value Ref Range   Glucose-Capillary 136 (H) 70 - 99 mg/dL    ASSESSMENT: Kenneth Hooper is a 22 y.o. male, 1 Day Post-Op s/p OPEN REDUCTION INTERNAL FIXATION (ORIF) RIGHT ELBOW/OLECRANON FRACTURE  CV/Blood loss: Hemoglobin 12.2 preop, CBC pending this a.m.  Hemodynamically stable  PLAN: Weightbearing: NWB RUE ROM: Okay for unrestricted elbow ROM as tolerated Incisional and dressing care: Reinforce dressings as needed  Showering: Not  medically cleared Orthopedic device(s): Sling as needed for comfort Pain management: Per trauma team VTE prophylaxis:  Per trauma team , SCDs ID:  Ancef 2gm post op Foley/Lines:  No foley, KVO IVFs Impediments to Fracture Healing: Vitamin D level pending, will start supplementation as indicated Dispo: Patient currently intubated and slightly agitated.  PT/OT once able.    D/C recommendations: -No DVT prophylaxis needed from ortho standpoint  -Possible need for Vit D supplementation  Follow - up plan:  To be determined   Contact information:  Katha Hamming MD, Rushie Nyhan PA-C. After hours and holidays please check [email or web address] for group call information for [facility]   Gwinda Passe, PA-C [phone] (office) [email or web address]
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
## 2022-08-25 ENCOUNTER — Encounter (HOSPITAL_COMMUNITY): Payer: Self-pay

## 2022-08-25 ENCOUNTER — Ambulatory Visit (HOSPITAL_COMMUNITY): Payer: Medicaid Other | Admitting: Mental Health

## 2022-08-25 ENCOUNTER — Other Ambulatory Visit: Payer: Self-pay

## 2022-08-25 DIAGNOSIS — I71019 Dissection of thoracic aorta, unspecified: Secondary | ICD-10-CM | POA: Diagnosis not present

## 2022-08-25 LAB — CBC
HCT: 25.8 % — ABNORMAL LOW (ref 39.0–52.0)
Hemoglobin: 8.9 g/dL — ABNORMAL LOW (ref 13.0–17.0)
MCH: 29.6 pg (ref 26.0–34.0)
MCHC: 34.5 g/dL (ref 30.0–36.0)
MCV: 85.7 fL (ref 80.0–100.0)
Platelets: 101 10*3/uL — ABNORMAL LOW (ref 150–400)
RBC: 3.01 MIL/uL — ABNORMAL LOW (ref 4.22–5.81)
RDW: 11.9 % (ref 11.5–15.5)
WBC: 7.6 10*3/uL (ref 4.0–10.5)
nRBC: 0 % (ref 0.0–0.2)

## 2022-08-25 LAB — GLUCOSE, CAPILLARY
Glucose-Capillary: 126 mg/dL — ABNORMAL HIGH (ref 70–99)
Glucose-Capillary: 127 mg/dL — ABNORMAL HIGH (ref 70–99)
Glucose-Capillary: 134 mg/dL — ABNORMAL HIGH (ref 70–99)
Glucose-Capillary: 149 mg/dL — ABNORMAL HIGH (ref 70–99)
Glucose-Capillary: 159 mg/dL — ABNORMAL HIGH (ref 70–99)
Glucose-Capillary: 161 mg/dL — ABNORMAL HIGH (ref 70–99)

## 2022-08-25 MED ORDER — CLONAZEPAM 0.25 MG PO TBDP
0.5000 mg | ORAL_TABLET | Freq: Two times a day (BID) | ORAL | Status: DC
  Administered 2022-08-25 – 2022-08-27 (×5): 0.5 mg
  Filled 2022-08-25 (×5): qty 2

## 2022-08-25 MED ORDER — METOPROLOL TARTRATE 5 MG/5ML IV SOLN
5.0000 mg | Freq: Four times a day (QID) | INTRAVENOUS | Status: DC | PRN
  Filled 2022-08-25: qty 5

## 2022-08-25 MED ORDER — QUETIAPINE FUMARATE 25 MG PO TABS
150.0000 mg | ORAL_TABLET | Freq: Two times a day (BID) | ORAL | Status: DC
  Administered 2022-08-25 – 2022-08-27 (×5): 150 mg
  Filled 2022-08-25 (×5): qty 2
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
## 2022-08-25 NOTE — Progress Notes (Signed)
Inpatient Rehab Admissions Coordinator Note:   Per therapy recommendations patient was screened for CIR candidacy by Michel Santee, PT. At this time, pt appears to be a potential candidate for CIR. I will place an order for rehab consult for full assessment, per our protocol.  Please contact me any with questions.Shann Medal, PT, DPT [phone] 08/25/22 11:02 AM
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
## 2022-08-26 ENCOUNTER — Encounter (HOSPITAL_COMMUNITY): Payer: Self-pay

## 2022-08-26 DIAGNOSIS — R451 Restlessness and agitation: Secondary | ICD-10-CM | POA: Diagnosis present

## 2022-08-26 DIAGNOSIS — F4325 Adjustment disorder with mixed disturbance of emotions and conduct: Secondary | ICD-10-CM | POA: Diagnosis not present

## 2022-08-26 LAB — COMPREHENSIVE METABOLIC PANEL
ALT: 48 U/L — ABNORMAL HIGH (ref 0–44)
AST: 61 U/L — ABNORMAL HIGH (ref 15–41)
Albumin: 2.8 g/dL — ABNORMAL LOW (ref 3.5–5.0)
Alkaline Phosphatase: 50 U/L (ref 38–126)
Anion gap: 7 (ref 5–15)
BUN: 12 mg/dL (ref 6–20)
CO2: 23 mmol/L (ref 22–32)
Calcium: 8.7 mg/dL — ABNORMAL LOW (ref 8.9–10.3)
Chloride: 110 mmol/L (ref 98–111)
Creatinine, Ser: 0.92 mg/dL (ref 0.61–1.24)
GFR, Estimated: >60 mL/min (ref >60)
Glucose, Bld: 142 mg/dL — ABNORMAL HIGH (ref 70–99)
Potassium: 3.3 mmol/L — ABNORMAL LOW (ref 3.5–5.1)
Sodium: 140 mmol/L (ref 135–145)
Total Bilirubin: 0.5 mg/dL (ref 0.3–1.2)
Total Protein: 6 g/dL — ABNORMAL LOW (ref 6.5–8.1)

## 2022-08-26 LAB — CBC
HCT: 28.4 % — ABNORMAL LOW (ref 39.0–52.0)
Hemoglobin: 9.8 g/dL — ABNORMAL LOW (ref 13.0–17.0)
MCH: 29.3 pg (ref 26.0–34.0)
MCHC: 34.5 g/dL (ref 30.0–36.0)
MCV: 85 fL (ref 80.0–100.0)
Platelets: 135 10*3/uL — ABNORMAL LOW (ref 150–400)
RBC: 3.34 MIL/uL — ABNORMAL LOW (ref 4.22–5.81)
RDW: 12.2 % (ref 11.5–15.5)
WBC: 6.8 10*3/uL (ref 4.0–10.5)
nRBC: 0 % (ref 0.0–0.2)

## 2022-08-26 LAB — GLUCOSE, CAPILLARY
Glucose-Capillary: 150 mg/dL — ABNORMAL HIGH (ref 70–99)
Glucose-Capillary: 133 mg/dL — ABNORMAL HIGH (ref 70–99)
Glucose-Capillary: 124 mg/dL — ABNORMAL HIGH (ref 70–99)
Glucose-Capillary: 137 mg/dL — ABNORMAL HIGH (ref 70–99)
Glucose-Capillary: 146 mg/dL — ABNORMAL HIGH (ref 70–99)
Glucose-Capillary: 129 mg/dL — ABNORMAL HIGH (ref 70–99)

## 2022-08-26 MED ORDER — VALPROATE SODIUM 100 MG/ML IV SOLN
250.0000 mg | Freq: Two times a day (BID) | INTRAVENOUS | Status: DC
  Administered 2022-08-26 – 2022-08-27 (×2): 250 mg via INTRAVENOUS
  Filled 2022-08-26 (×4): qty 2.5

## 2022-08-26 MED ORDER — POTASSIUM CHLORIDE 20 MEQ PO PACK
60.0000 meq | PACK | Freq: Once | ORAL | Status: AC
  Administered 2022-08-26: 60 meq
  Filled 2022-08-26: qty 3

## 2022-08-26 MED ORDER — KETAMINE HCL 50 MG/5ML IJ SOSY: 50.0000 mg | PREFILLED_SYRINGE | Freq: Once | INTRAMUSCULAR | Status: DC | PRN

## 2022-08-26 MED ORDER — ZIPRASIDONE MESYLATE 20 MG IM SOLR
20.0000 mg | Freq: Once | INTRAMUSCULAR | Status: DC | PRN
  Filled 2022-08-26: qty 20

## 2022-08-26 MED ORDER — STERILE WATER FOR INJECTION IJ SOLN
INTRAMUSCULAR | Status: AC
  Filled 2022-08-26: qty 10
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
## 2022-08-27 ENCOUNTER — Encounter (HOSPITAL_COMMUNITY): Payer: Self-pay

## 2022-08-27 DIAGNOSIS — R451 Restlessness and agitation: Secondary | ICD-10-CM | POA: Diagnosis not present

## 2022-08-27 LAB — BASIC METABOLIC PANEL
Anion gap: 10 (ref 5–15)
BUN: 15 mg/dL (ref 6–20)
CO2: 23 mmol/L (ref 22–32)
Calcium: 9.2 mg/dL (ref 8.9–10.3)
Chloride: 107 mmol/L (ref 98–111)
Creatinine, Ser: 1.01 mg/dL (ref 0.61–1.24)
GFR, Estimated: >60 mL/min (ref >60)
Glucose, Bld: 85 mg/dL (ref 70–99)
Potassium: 3.5 mmol/L (ref 3.5–5.1)
Sodium: 140 mmol/L (ref 135–145)

## 2022-08-27 LAB — MAGNESIUM: Magnesium: 1.9 mg/dL (ref 1.7–2.4)

## 2022-08-27 LAB — GLUCOSE, CAPILLARY
Glucose-Capillary: 128 mg/dL — ABNORMAL HIGH (ref 70–99)
Glucose-Capillary: 101 mg/dL — ABNORMAL HIGH (ref 70–99)

## 2022-08-27 MED ORDER — DOCUSATE SODIUM 100 MG PO CAPS
100.0000 mg | ORAL_CAPSULE | Freq: Two times a day (BID) | ORAL | Status: DC
  Administered 2022-08-27: 100 mg via ORAL
  Filled 2022-08-27: qty 1

## 2022-08-27 MED ORDER — ACETAMINOPHEN 500 MG PO TABS
1000.0000 mg | ORAL_TABLET | Freq: Four times a day (QID) | ORAL | Status: DC
  Administered 2022-08-27 – 2022-08-28 (×5): 1000 mg via ORAL
  Filled 2022-08-27 (×5): qty 2

## 2022-08-27 MED ORDER — HYDRALAZINE HCL 20 MG/ML IJ SOLN
10.0000 mg | Freq: Four times a day (QID) | INTRAMUSCULAR | Status: DC | PRN
  Administered 2022-08-27: 20 mg via INTRAVENOUS
  Filled 2022-08-27 (×2): qty 1

## 2022-08-27 MED ORDER — MORPHINE SULFATE (PF) 2 MG/ML IV SOLN
2.0000 mg | Freq: Four times a day (QID) | INTRAVENOUS | Status: DC | PRN
  Administered 2022-08-27 (×3): 2 mg via INTRAVENOUS
  Administered 2022-08-28: 4 mg via INTRAVENOUS
  Filled 2022-08-27 (×2): qty 1
  Filled 2022-08-27: qty 2
  Filled 2022-08-27: qty 1

## 2022-08-27 MED ORDER — METHOCARBAMOL 500 MG PO TABS
1000.0000 mg | ORAL_TABLET | Freq: Three times a day (TID) | ORAL | Status: DC
  Administered 2022-08-27 – 2022-08-28 (×4): 1000 mg via ORAL
  Filled 2022-08-27 (×4): qty 2

## 2022-08-27 MED ORDER — VALPROIC ACID 250 MG PO CAPS
250.0000 mg | ORAL_CAPSULE | Freq: Two times a day (BID) | ORAL | Status: DC
  Filled 2022-08-27: qty 1

## 2022-08-27 MED ORDER — QUETIAPINE FUMARATE 50 MG PO TABS
150.0000 mg | ORAL_TABLET | Freq: Two times a day (BID) | ORAL | Status: DC
  Administered 2022-08-27 – 2022-08-28 (×2): 150 mg via ORAL
  Filled 2022-08-27 (×2): qty 1

## 2022-08-27 MED ORDER — POLYETHYLENE GLYCOL 3350 17 G PO PACK
17.0000 g | PACK | Freq: Every day | ORAL | Status: DC
  Filled 2022-08-27: qty 1

## 2022-08-27 MED ORDER — CLONAZEPAM 0.25 MG PO TBDP
0.5000 mg | ORAL_TABLET | Freq: Two times a day (BID) | ORAL | Status: DC
  Administered 2022-08-27 – 2022-08-28 (×2): 0.5 mg via ORAL
  Filled 2022-08-27 (×2): qty 2

## 2022-08-27 MED ORDER — VALPROIC ACID 250 MG PO CAPS
250.0000 mg | ORAL_CAPSULE | Freq: Two times a day (BID) | ORAL | Status: DC
  Administered 2022-08-27 – 2022-08-28 (×2): 250 mg via ORAL
  Filled 2022-08-27 (×3): qty 1

## 2022-08-27 MED ORDER — POTASSIUM CHLORIDE CRYS ER 20 MEQ PO TBCR
40.0000 meq | EXTENDED_RELEASE_TABLET | Freq: Once | ORAL | Status: AC
  Administered 2022-08-27: 40 meq via ORAL
  Filled 2022-08-27: qty 2

## 2022-08-27 MED ORDER — POTASSIUM CHLORIDE 20 MEQ PO PACK: 40.0000 meq | PACK | Freq: Once | ORAL | Status: DC
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
## 2022-08-27 NOTE — Progress Notes (Signed)
At 0430 patient had 1x episode of emesis which was TF colored. Pt treated with 4 mg IVP Zofran and now reports no nausea. In the act, patient's Cortrak came out. TF were turned off and patient was cleaned and new linens were placed.  TRN notified of patient events and interventions. No new orders at this time.
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
## 2022-08-28 MED ORDER — QUETIAPINE FUMARATE 25 MG PO TABS: 25.0000 mg | ORAL_TABLET | Freq: Every day | ORAL | 0 refills | Status: AC

## 2022-08-28 MED ORDER — DOCUSATE SODIUM 100 MG PO CAPS: 100.0000 mg | ORAL_CAPSULE | Freq: Two times a day (BID) | ORAL | 0 refills | Status: AC

## 2022-08-28 MED ORDER — OXYCODONE HCL 5 MG PO TABS: 5.0000 mg | ORAL_TABLET | ORAL | 0 refills | Status: DC | PRN

## 2022-08-28 MED ORDER — POLYETHYLENE GLYCOL 3350 17 G PO PACK: 17.0000 g | PACK | Freq: Every day | ORAL | 0 refills | Status: AC

## 2022-08-28 MED ORDER — QUETIAPINE FUMARATE 150 MG PO TABS: ORAL_TABLET | ORAL | 0 refills | Status: AC

## 2022-08-28 MED ORDER — VALPROIC ACID 250 MG PO CAPS: 250.0000 mg | ORAL_CAPSULE | Freq: Two times a day (BID) | ORAL | 0 refills | Status: AC

## 2022-08-28 MED ORDER — METHOCARBAMOL 1000 MG PO TABS: 1000.0000 mg | ORAL_TABLET | Freq: Three times a day (TID) | ORAL | 0 refills | Status: AC | PRN

## 2022-08-28 MED ORDER — ACETAMINOPHEN 500 MG PO TABS: 1000.0000 mg | ORAL_TABLET | Freq: Four times a day (QID) | ORAL | 0 refills | Status: AC

## 2022-08-28 NOTE — Progress Notes (Signed)
Patient very anxious to go home this morning but was oriented to situation fully.  Aunt was on phone and wanted to know the discharge plan.  His current plan recommendation is to go to rehab and I informed them both of that.  MD notified he was anxious and wanted to go.  Presently the patient is sleeping so I will notify MD if he get agitated to leave again.  MD would like him to go to rehab if he will be compliant.

## 2022-08-28 NOTE — Progress Notes (Signed)
Patient discharge teaching complete with patient and family member.
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
## 2022-08-28 NOTE — TOC Transition Note (Signed)
Transition of Care [facility]) - CM/SW Discharge Note   Patient Details  Name: Kenneth Hooper MRN: [id number] Date of Birth: 17-Jun-2001  Transition [facility]) CM/SW Contact:  Tom-Johnson, Renea Ee, RN Phone Number: 08/28/2022, 2:12 PM   Clinical Narrative:     Patient is scheduled for discharge today. No PT f/u noted, outpatient OT recommended. Patient has Medicaid and Home heath agencies do not accept OT discipline by itself. Outpatient OT order paced for [facility].  Mother to transport at discharge. No further TOC needs noted.          Final next level of care: OP Rehab Barriers to Discharge: Barriers Resolved   Patient Goals and CMS Choice CMS Medicare.gov Compare Post Acute Care list provided to:: Patient Choice offered to / list presented to : Patient, Parent (Mother, Molli Hazard)  Discharge Placement                  Patient to be transferred to facility by: Mother      Discharge Plan and Services Additional resources added to the After Visit Summary for                  DME Arranged: N/A DME Agency: NA       HH Arranged: NA HH Agency: NA        Social Determinants of Health (SDOH) Interventions SDOH Screenings   Food Insecurity: No Food Insecurity (08/25/2022)  Housing: Low Risk  (08/25/2022)  Transportation Needs: No Transportation Needs (08/25/2022)  Utilities: Not At Risk (08/25/2022)  Tobacco Use: Unknown (08/27/2022)     Readmission Risk Interventions     No data to display

## 2022-08-28 NOTE — Discharge Instructions (Addendum)
Do NOT bear any weight on your right arm. You may have unrestricted range of motion of the right elbow.  Take all medications as prescribed. Do not skip any doses.

## 2022-09-11 DIAGNOSIS — I71019 Dissection of thoracic aorta, unspecified: Secondary | ICD-10-CM | POA: Insufficient documentation

## 2022-09-15 ENCOUNTER — Other Ambulatory Visit: Payer: Self-pay

## 2022-09-15 DIAGNOSIS — I71019 Dissection of thoracic aorta, unspecified: Secondary | ICD-10-CM
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
## 2022-10-04 ENCOUNTER — Encounter: Payer: Medicaid Other | Admitting: Surgery

## 2022-10-05 ENCOUNTER — Ambulatory Visit (HOSPITAL_COMMUNITY): Admission: RE | Admit: 2022-10-05 | Payer: Medicaid Other | Source: Ambulatory Visit

## 2022-10-18 ENCOUNTER — Encounter: Payer: Medicaid Other | Admitting: Surgery

## 2023-09-14 ENCOUNTER — Other Ambulatory Visit: Payer: Self-pay

## 2023-09-14 ENCOUNTER — Other Ambulatory Visit (HOSPITAL_BASED_OUTPATIENT_CLINIC_OR_DEPARTMENT_OTHER): Payer: Self-pay

## 2023-09-14 ENCOUNTER — Encounter (HOSPITAL_BASED_OUTPATIENT_CLINIC_OR_DEPARTMENT_OTHER): Payer: Self-pay | Admitting: Emergency Medicine

## 2023-09-14 ENCOUNTER — Emergency Department (HOSPITAL_BASED_OUTPATIENT_CLINIC_OR_DEPARTMENT_OTHER)

## 2023-09-14 ENCOUNTER — Emergency Department (HOSPITAL_BASED_OUTPATIENT_CLINIC_OR_DEPARTMENT_OTHER)
Admission: EM | Admit: 2023-09-14 | Discharge: 2023-09-14 | Disposition: A | Discharge status: Home / Self Care | Attending: Emergency Medicine | Admitting: Emergency Medicine

## 2023-09-14 DIAGNOSIS — S59902A Unspecified injury of left elbow, initial encounter: Secondary | ICD-10-CM | POA: Diagnosis present

## 2023-09-14 DIAGNOSIS — S42295A Other nondisplaced fracture of upper end of left humerus, initial encounter for closed fracture: Secondary | ICD-10-CM | POA: Diagnosis not present

## 2023-09-14 DIAGNOSIS — M7989 Other specified soft tissue disorders: Secondary | ICD-10-CM | POA: Insufficient documentation

## 2023-09-14 DIAGNOSIS — S60221A Contusion of right hand, initial encounter: Secondary | ICD-10-CM | POA: Insufficient documentation

## 2023-09-14 DIAGNOSIS — S42402A Unspecified fracture of lower end of left humerus, initial encounter for closed fracture: Secondary | ICD-10-CM

## 2023-09-14 MED ORDER — HYDROCODONE-ACETAMINOPHEN 5-325 MG PO TABS
1.0000 | ORAL_TABLET | Freq: Four times a day (QID) | ORAL | 0 refills | Status: AC | PRN
  Filled 2023-09-14: qty 8, 2d supply, fill #0

## 2023-09-14 MED ORDER — NAPROXEN 375 MG PO TABS
375.0000 mg | ORAL_TABLET | Freq: Two times a day (BID) | ORAL | 0 refills | Status: AC
  Filled 2023-09-14: qty 20, 10d supply, fill #0

## 2023-09-14 MED ORDER — OXYCODONE-ACETAMINOPHEN 5-325 MG PO TABS
1.0000 | ORAL_TABLET | Freq: Once | ORAL | Status: AC
  Administered 2023-09-14: 1 via ORAL
  Filled 2023-09-14: qty 1

## 2023-09-14 NOTE — Discharge Instructions (Addendum)
Contact a health care provider if: You have pain that gets worse. Your hand and fingers swell. Your cast or splint becomes loose or damaged. Get help right away if: You lose feeling in your hand or fingers. Your hand or fingers get cold or turn pale or blue. 
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
## 2023-09-15 ENCOUNTER — Other Ambulatory Visit (HOSPITAL_BASED_OUTPATIENT_CLINIC_OR_DEPARTMENT_OTHER): Payer: Self-pay

## 2023-10-25 ENCOUNTER — Other Ambulatory Visit (HOSPITAL_BASED_OUTPATIENT_CLINIC_OR_DEPARTMENT_OTHER): Payer: Self-pay

## 2023-12-08 ENCOUNTER — Other Ambulatory Visit (HOSPITAL_BASED_OUTPATIENT_CLINIC_OR_DEPARTMENT_OTHER): Payer: Self-pay
